# Patient Record
Sex: Female | Born: 1990 | Race: Black or African American | Hispanic: No | Marital: Single | Smoking: Never smoker
Health system: Southern US, Community
[De-identification: ages and names within clinical notes are randomized; demographics above are authoritative.]

---
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
## 2012-01-20 ENCOUNTER — Emergency Department (HOSPITAL_COMMUNITY)
Admission: EM | Admit: 2012-01-20 | Discharge: 2012-01-20 | Disposition: A | Discharge status: Home / Self Care | Payer: BC Managed Care – PPO | Source: Home / Self Care

## 2012-01-20 ENCOUNTER — Encounter (HOSPITAL_COMMUNITY): Payer: Self-pay | Admitting: Emergency Medicine

## 2012-01-20 DIAGNOSIS — K2970 Gastritis, unspecified, without bleeding: Secondary | ICD-10-CM

## 2012-01-20 LAB — POCT H PYLORI SCREEN: H. PYLORI SCREEN, POC: NEGATIVE

## 2012-01-20 MED ORDER — LACTINEX PO CHEW: 1.0000 | CHEWABLE_TABLET | Freq: Three times a day (TID) | ORAL | Status: DC

## 2012-01-20 MED ORDER — OMEPRAZOLE 20 MG PO CPDR: 20.0000 mg | DELAYED_RELEASE_CAPSULE | Freq: Every day | ORAL | Status: AC

## 2012-01-20 NOTE — ED Provider Notes (Signed)
Joanne Mosley  CC: abdominal pain  HPI:  21 y.o. female with abdominal pain starting 2 weeks ago. Pain is diffuse, across both sides. Worst is 7-8/10.  Happens 5-7 times a day, lasts for about 1 minute. Goes away by itself. There when wakes up. When moves a lot, sometimes when eating and gets nausea. Has not vomited.  Sometimes feels like gas pains, other times like going to have diarrhea, sometimes like period cramps or food poisoning. In AM feels like about to throw up. Occasional funny feeling in chest. Stool has different odor, not as solid. BM once a day. No blood or dark tarry stools. No dysuria, no fever/chills. Sister has same symptoms during same time frame and dx with H. Pylori recently. Sees Dr. Jacqualine Mau in [place]. Has been under a lot of stress recently. Pt has changed diet recently - eating more vegetables, vegetarian foods.  PMH:  Eczema  PSH:  None  MEDS:  OCPs, cream for eczema  No Known Allergies  SOC:  No smoking, no EtOH or drugs  ROS:  Negative except as stated in HPI  PHYS EXAM: Filed Vitals:   01/20/12 1016  BP: 122/64  Pulse: 92  Temp: 98.9 F (37.2 C)  Resp: 18   GEN:  WNWD, no distress HEENT:  Conjunctive clear, EOMI, NCAT CV:  RRR, no murmur RESP:  CTAB ABD:  Normal bowel sounds all 4 quadrants, mild tenderness in all 4 quadrants, none suprapubically, tympanitic to percussino SKIN:  WWP NEURO:  No focal defects, alert and oriented x 4  LABS:  H. Pylori rapid antibody test NEGATIVE  A/P 21 y.o. female with abdominal pain. Gastritis vs IBS vs reaction to vegetarian diet - Trial of prilosec and lactinex - OTC gas meds if needed - F/U with PCP in December as scheduled  Napoleon Form, MD   Napoleon Form, MD 01/20/12 1106

## 2012-01-20 NOTE — ED Notes (Signed)
Reports stomach pain for a week and half.  Patient says it is on and off pain.  Denies vomiting but does have nausea.  Patient cycle was two and half weeks ago.

## 2012-04-11 IMAGING — CR DG ANKLE COMPLETE 3+V*R*
3 series · 3 of 3 positions shown · non-contrast
Comparison: None

CLINICAL DATA: Twisting injury.  Pain and swelling laterally.

RIGHT ANKLE - COMPLETE 3+ VIEW

[view not recorded (1 of 3)]
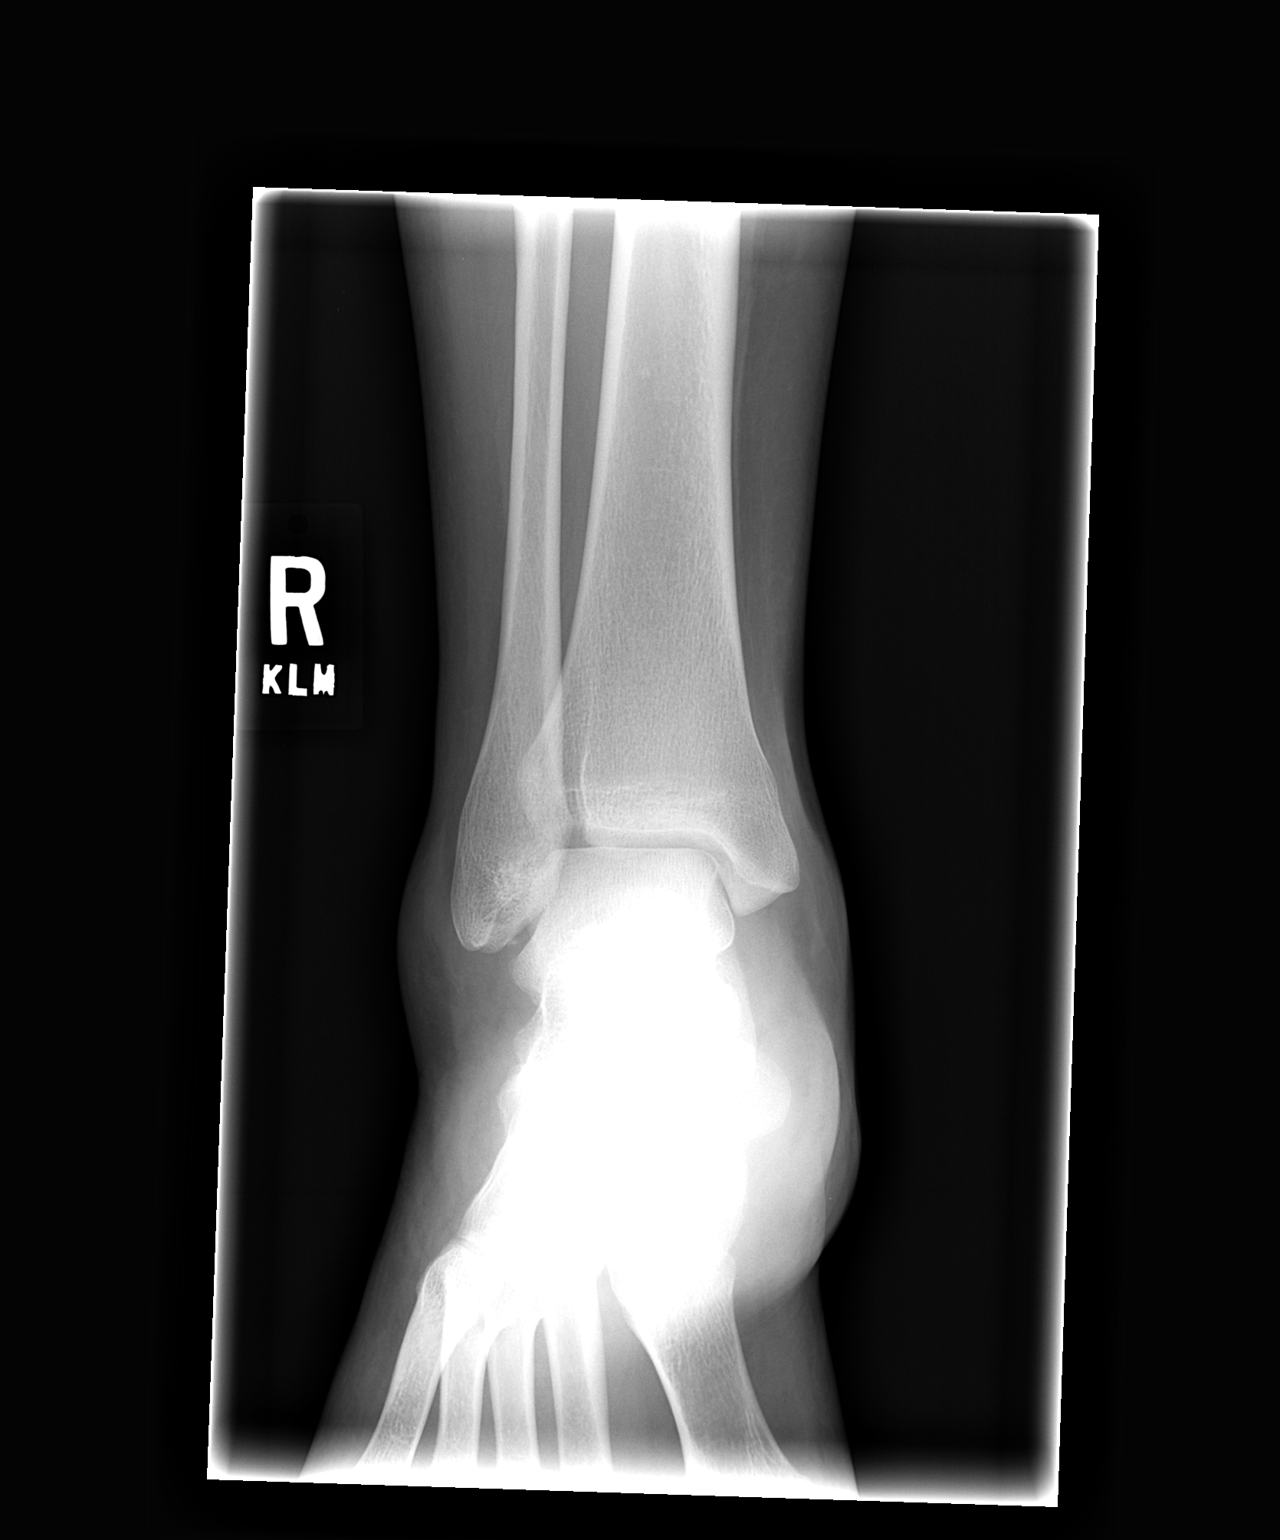

[view not recorded (2 of 3)]
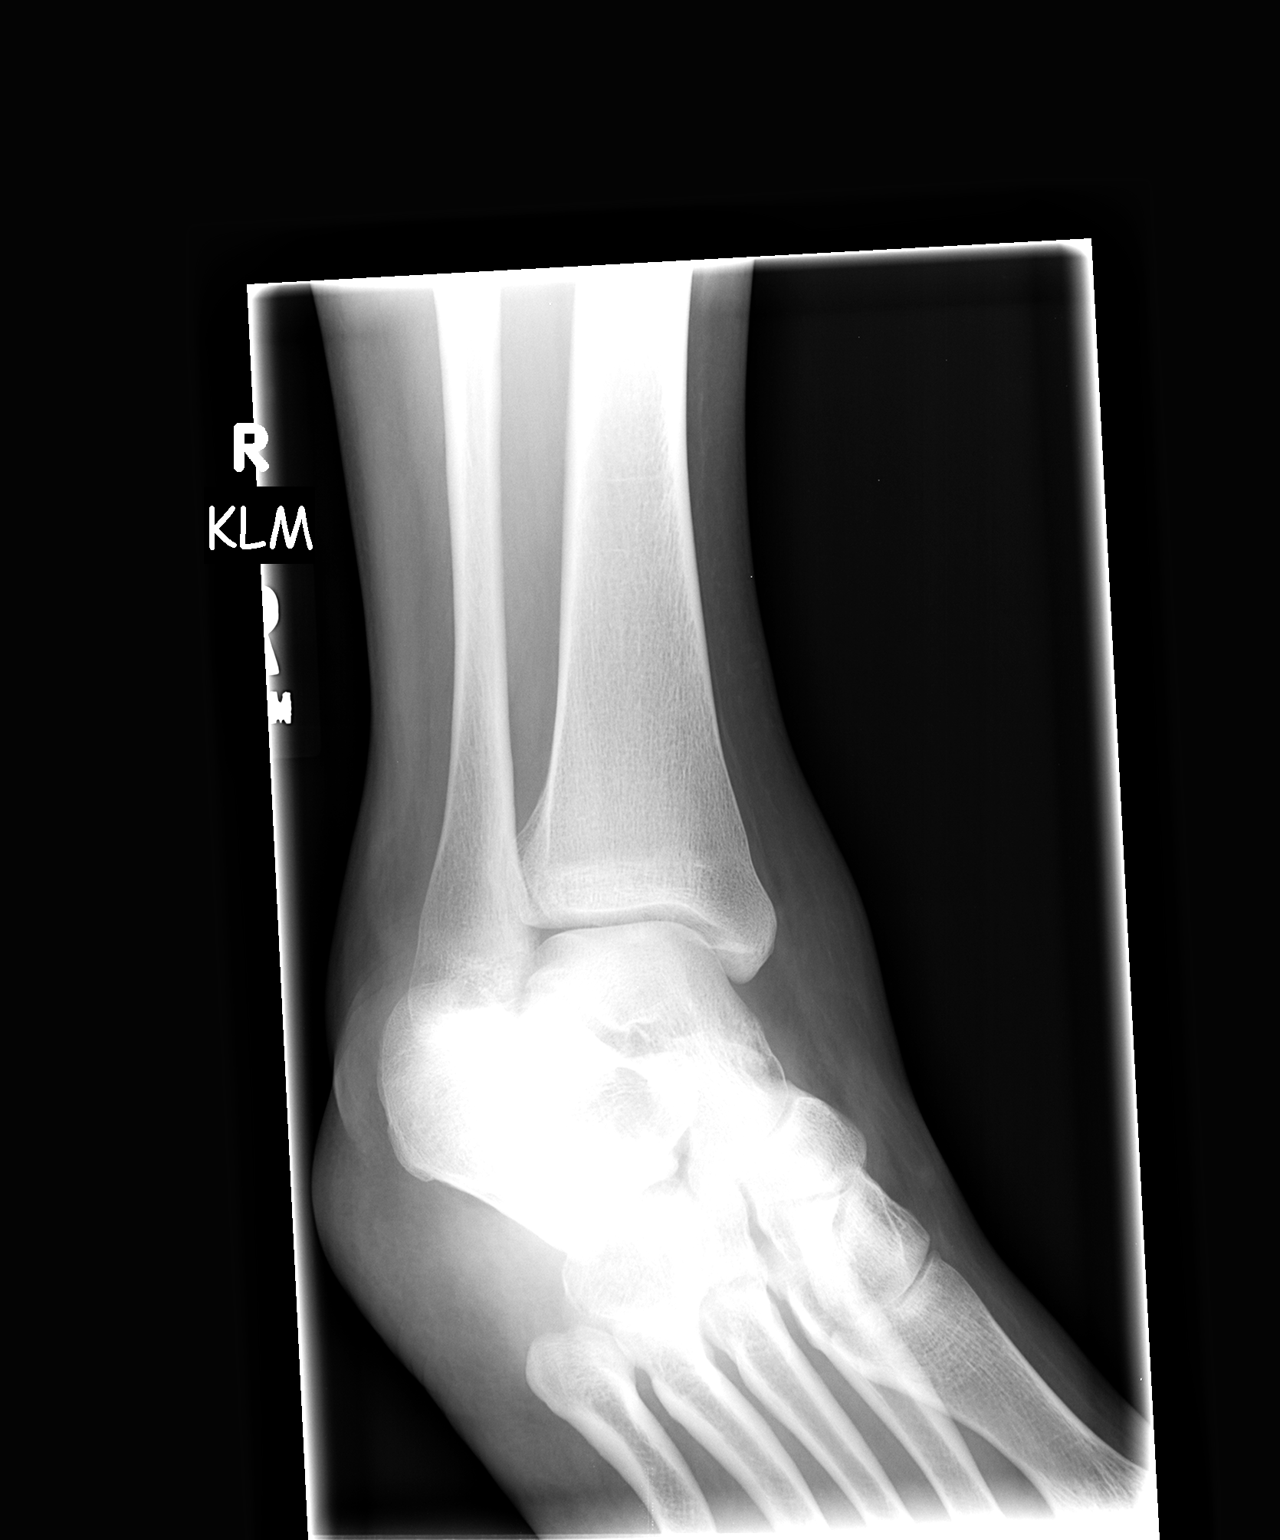

[view not recorded (3 of 3)]
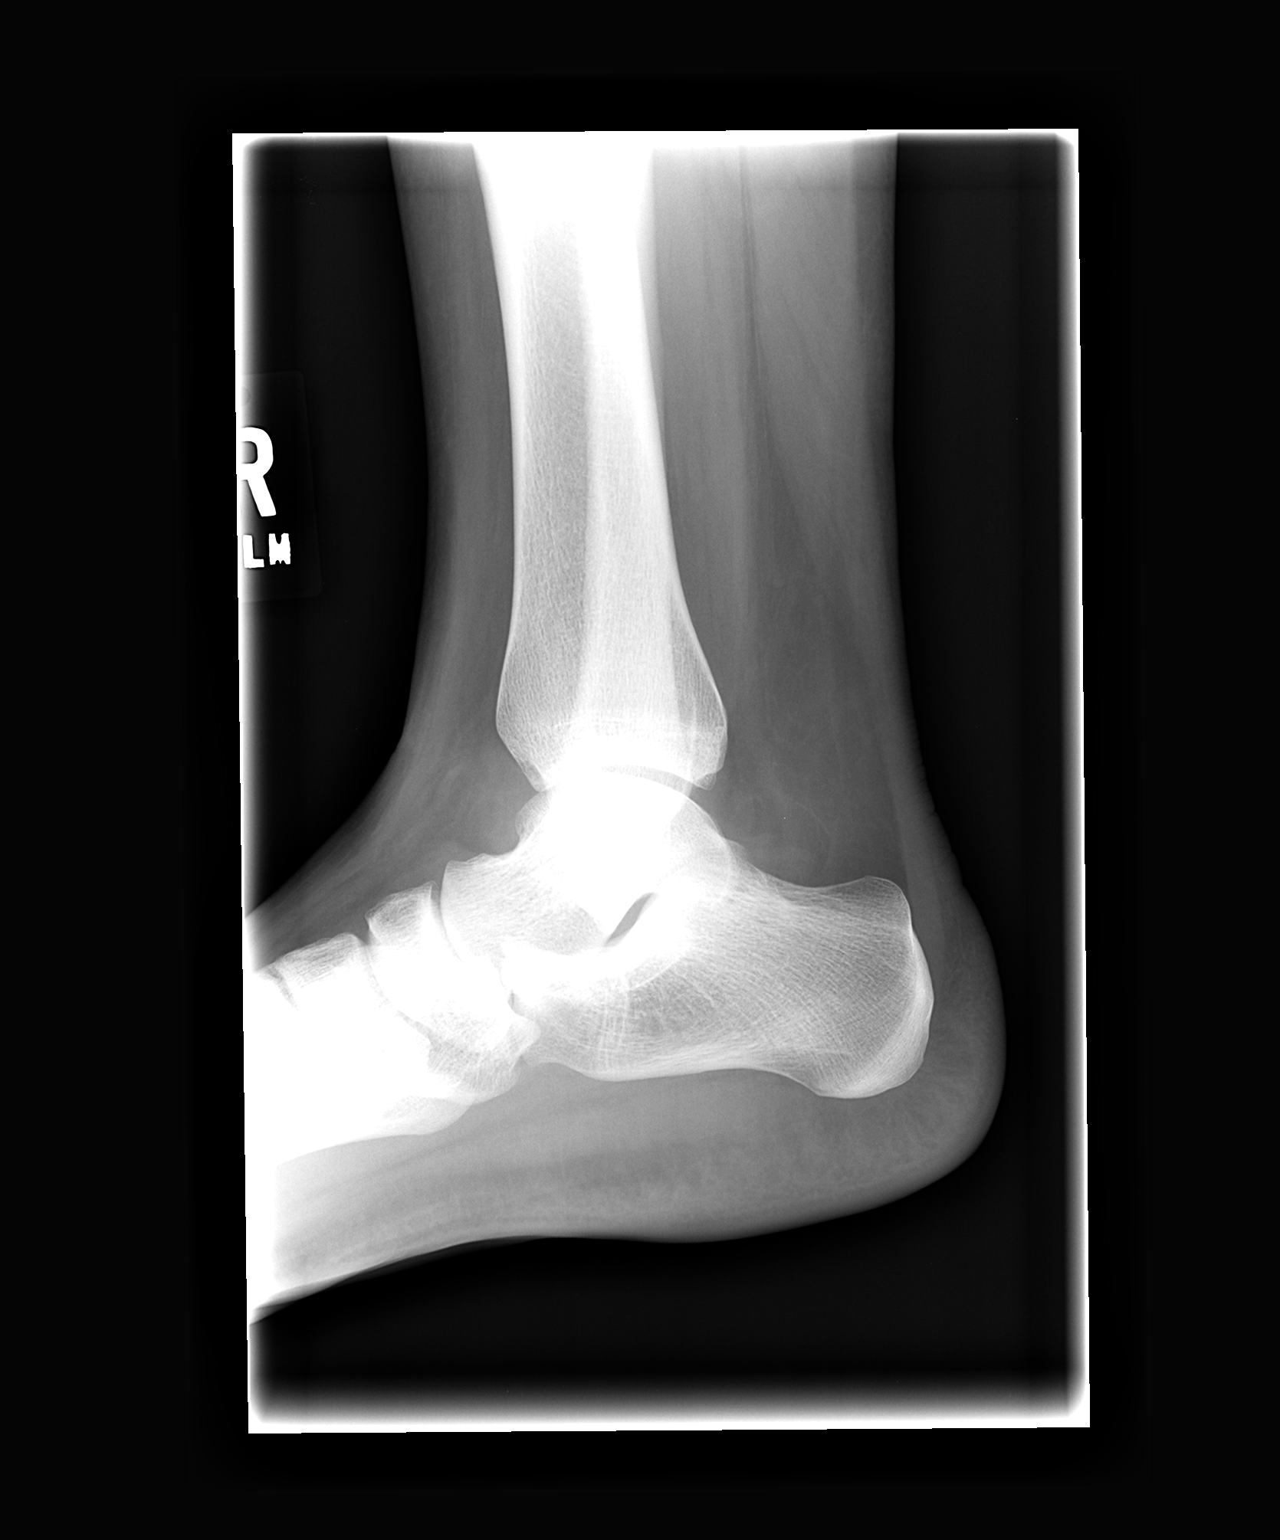

[3 of 3 positions shown; findings below may reference images not displayed]

FINDINGS: There is lateral soft tissue swelling.  There is a joint
effusion.  There may be a tiny ligamentous a avulsion at the tip of
the fibula.  No major fracture.
IMPRESSION: Joint effusion and lateral soft tissue swelling.  Possible tiny
ligamentous avulsion fracture at the tip of the fibula.

## 2013-08-12 ENCOUNTER — Emergency Department (HOSPITAL_COMMUNITY)
Admission: EM | Admit: 2013-08-12 | Discharge: 2013-08-12 | Disposition: A | Discharge status: Home / Self Care | Payer: BC Managed Care – PPO | Attending: Emergency Medicine | Admitting: Emergency Medicine

## 2013-08-12 ENCOUNTER — Encounter (HOSPITAL_COMMUNITY): Payer: Self-pay | Admitting: Emergency Medicine

## 2013-08-12 DIAGNOSIS — R1110 Vomiting, unspecified: Secondary | ICD-10-CM | POA: Insufficient documentation

## 2013-08-12 DIAGNOSIS — Z79899 Other long term (current) drug therapy: Secondary | ICD-10-CM | POA: Insufficient documentation

## 2013-08-12 LAB — POC URINE PREG, ED: PREG TEST UR: NEGATIVE

## 2013-08-12 LAB — CBC WITH DIFFERENTIAL/PLATELET
BASOS PCT: 0 % (ref 0–1)
Basophils Absolute: 0 10*3/uL (ref 0.0–0.1)
EOS ABS: 0 10*3/uL (ref 0.0–0.7)
Eosinophils Relative: 0 % (ref 0–5)
HEMATOCRIT: 38.7 % (ref 36.0–46.0)
HEMOGLOBIN: 12.7 g/dL (ref 12.0–15.0)
Lymphocytes Relative: 3 % — ABNORMAL LOW (ref 12–46)
Lymphs Abs: 0.6 10*3/uL — ABNORMAL LOW (ref 0.7–4.0)
MCH: 27.1 pg (ref 26.0–34.0)
MCHC: 32.8 g/dL (ref 30.0–36.0)
MCV: 82.7 fL (ref 78.0–100.0)
MONO ABS: 0.8 10*3/uL (ref 0.1–1.0)
MONOS PCT: 5 % (ref 3–12)
Neutro Abs: 15.4 10*3/uL — ABNORMAL HIGH (ref 1.7–7.7)
Neutrophils Relative %: 92 % — ABNORMAL HIGH (ref 43–77)
Platelets: 244 10*3/uL (ref 150–400)
RBC: 4.68 MIL/uL (ref 3.87–5.11)
RDW: 13.9 % (ref 11.5–15.5)
WBC: 16.8 10*3/uL — ABNORMAL HIGH (ref 4.0–10.5)

## 2013-08-12 LAB — COMPREHENSIVE METABOLIC PANEL
ALBUMIN: 3.6 g/dL (ref 3.5–5.2)
ALT: 11 U/L (ref 0–35)
AST: 15 U/L (ref 0–37)
Alkaline Phosphatase: 76 U/L (ref 39–117)
BILIRUBIN TOTAL: 0.4 mg/dL (ref 0.3–1.2)
BUN: 7 mg/dL (ref 6–23)
CO2: 24 mEq/L (ref 19–32)
CREATININE: 0.62 mg/dL (ref 0.50–1.10)
Calcium: 9.2 mg/dL (ref 8.4–10.5)
Chloride: 102 mEq/L (ref 96–112)
GFR calc Af Amer: >90 mL/min (ref >90)
GFR calc non Af Amer: >90 mL/min (ref >90)
Glucose, Bld: 90 mg/dL (ref 70–99)
Potassium: 4.2 mEq/L (ref 3.7–5.3)
Sodium: 139 mEq/L (ref 137–147)
TOTAL PROTEIN: 7.9 g/dL (ref 6.0–8.3)

## 2013-08-12 LAB — URINALYSIS, ROUTINE W REFLEX MICROSCOPIC
Bilirubin Urine: NEGATIVE
GLUCOSE, UA: NEGATIVE mg/dL
Hgb urine dipstick: NEGATIVE
Ketones, ur: NEGATIVE mg/dL
LEUKOCYTES UA: NEGATIVE
NITRITE: NEGATIVE
PH: 6.5 (ref 5.0–8.0)
PROTEIN: NEGATIVE mg/dL
Specific Gravity, Urine: 1.03 (ref 1.005–1.030)
Urobilinogen, UA: 1 mg/dL (ref 0.0–1.0)

## 2013-08-12 MED ORDER — ONDANSETRON HCL 8 MG PO TABS: 8.0000 mg | ORAL_TABLET | Freq: Three times a day (TID) | ORAL | Status: AC | PRN

## 2013-08-12 NOTE — Discharge Instructions (Signed)
Start with a clear liquid diet, then gradually advance to bland foods over the next one to 2 days. See the doctor of your choice as needed for problems.   Nausea and Vomiting Nausea is a sick feeling that often comes before throwing up (vomiting). Vomiting is a reflex where stomach contents come out of your mouth. Vomiting can cause severe loss of body fluids (dehydration). Children and elderly adults can become dehydrated quickly, especially if they also have diarrhea. Nausea and vomiting are symptoms of a condition or disease. It is important to find the cause of your symptoms. CAUSES   Direct irritation of the stomach lining. This irritation can result from increased acid production (gastroesophageal reflux disease), infection, food poisoning, taking certain medicines (such as nonsteroidal anti-inflammatory drugs), alcohol use, or tobacco use.  Signals from the brain.These signals could be caused by a headache, heat exposure, an inner ear disturbance, increased pressure in the brain from injury, infection, a tumor, or a concussion, pain, emotional stimulus, or metabolic problems.  An obstruction in the gastrointestinal tract (bowel obstruction).  Illnesses such as diabetes, hepatitis, gallbladder problems, appendicitis, kidney problems, cancer, sepsis, atypical symptoms of a heart attack, or eating disorders.  Medical treatments such as chemotherapy and radiation.  Receiving medicine that makes you sleep (general anesthetic) during surgery. DIAGNOSIS Your caregiver may ask for tests to be done if the problems do not improve after a few days. Tests may also be done if symptoms are severe or if the reason for the nausea and vomiting is not clear. Tests may include:  Urine tests.  Blood tests.  Stool tests.  Cultures (to look for evidence of infection).  X-rays or other imaging studies. Test results can help your caregiver make decisions about treatment or the need for additional  tests. TREATMENT You need to stay well hydrated. Drink frequently but in small amounts.You may wish to drink water, sports drinks, clear broth, or eat frozen ice pops or gelatin dessert to help stay hydrated.When you eat, eating slowly may help prevent nausea.There are also some antinausea medicines that may help prevent nausea. HOME CARE INSTRUCTIONS   Take all medicine as directed by your caregiver.  If you do not have an appetite, do not force yourself to eat. However, you must continue to drink fluids.  If you have an appetite, eat a normal diet unless your caregiver tells you differently.  Eat a variety of complex carbohydrates (rice, wheat, potatoes, bread), lean meats, yogurt, fruits, and vegetables.  Avoid high-fat foods because they are more difficult to digest.  Drink enough water and fluids to keep your urine clear or pale yellow.  If you are dehydrated, ask your caregiver for specific rehydration instructions. Signs of dehydration may include:  Severe thirst.  Dry lips and mouth.  Dizziness.  Dark urine.  Decreasing urine frequency and amount.  Confusion.  Rapid breathing or pulse. SEEK IMMEDIATE MEDICAL CARE IF:   You have blood or brown flecks (like coffee grounds) in your vomit.  You have black or bloody stools.  You have a severe headache or stiff neck.  You are confused.  You have severe abdominal pain.  You have chest pain or trouble breathing.  You do not urinate at least once every 8 hours.  You develop cold or clammy skin.  You continue to vomit for longer than 24 to 48 hours.  You have a fever. MAKE SURE YOU:   Understand these instructions.  Will watch your condition.  Will get help  right away if you are not doing well or get worse. Document Released: 02/13/2005 Document Revised: 05/08/2011 Document Reviewed: 07/13/2010 [facility] Patient Information 2014 [employer], [place]. Diet The clear liquid diet consists of foods that are  liquid or will become liquid at room temperature. Examples of foods allowed on a clear liquid diet include fruit juice, broth or bouillon, gelatin, or frozen ice pops. You should be able to see through the liquid. The purpose of this diet is to provide the necessary fluids, electrolytes (such as sodium and potassium), and energy to keep the body functioning during times when you are not able to consume a regular diet. A clear liquid diet should not be continued for long periods of time, as it is not nutritionally adequate.  A CLEAR LIQUID DIET MAY BE NEEDED:  When a sudden-onset (acute) condition occurs before or after surgery.   As the first step in oral feeding.   For fluid and electrolyte replacement in diarrheal diseases.   As a diet before certain medical tests are performed.  ADEQUACY The clear liquid diet is adequate only in ascorbic acid, according to the Recommended Dietary Allowances of the [employer].  CHOOSING FOODS Breads and Starches  Allowed: None are allowed.   Avoid: All are to be avoided.  Vegetables  Allowed: Strained vegetable juices.   Avoid: Any others.  Fruit  Allowed: Strained fruit juices and fruit drinks. Include 1 serving of citrus or vitamin C-enriched fruit juice daily.   Avoid: Any others.  Meat and Meat Substitutes  Allowed: None are allowed.   Avoid: All are to be avoided.  Milk Products  Allowed: None are allowed.   Avoid: All are to be avoided.  Soups and Combination Foods  Allowed: Clear bouillon, broth, or strained broth-based soups.   Avoid: Any others.  Desserts and Sweets  Allowed: Sugar, honey. High-protein gelatin. Flavored gelatin, ices, or frozen ice pops that do not contain milk.   Avoid: Any others.  Fats and Oils  Allowed: None are allowed.   Avoid: All are to be avoided.  Beverages  Allowed: Cereal beverages, coffee (regular or decaffeinated), tea, or  soda at the discretion of your health care provider.   Avoid: Any others.  Condiments  Allowed: Salt.   Avoid: Any others, including pepper.  Supplements  Allowed: Liquid nutrition beverages that you can see through.   Avoid: Any others that contain lactose or fiber. SAMPLE MEAL PLAN Breakfast  4 oz (120 mL) strained orange juice.   to 1 cup (120 to 240 mL) gelatin (plain or fortified).  1 cup (240 mL) beverage (coffee or tea).  Sugar, if desired. Midmorning Snack   cup (120 mL) gelatin (plain or fortified). Lunch  1 cup (240 mL) broth or consomm.  4 oz (120 mL) strained grapefruit juice.   cup (120 mL) gelatin (plain or fortified).  1 cup (240 mL) beverage (coffee or tea).  Sugar, if desired. Midafternoon Snack   cup (120 mL) fruit ice.   cup (120 mL) strained fruit juice. Dinner  1 cup (240 mL) broth or consomm.   cup (120 mL) cranberry juice.   cup (120 mL) flavored gelatin (plain or fortified).  1 cup (240 mL) beverage (coffee or tea).  Sugar, if desired. Evening Snack  4 oz (120 mL) strained apple juice (vitamin C-fortified).   cup (120 mL) flavored gelatin (plain or fortified). MAKE SURE YOU:  Understand these instructions.  Will watch your child's condition.  Will get help right  away if your child is not doing well or gets worse. Document Released: 02/13/2005 Document Revised: 10/16/2012 Document Reviewed: 07/16/2012 [facility] PhoenixExitCare Patient Information 2014 [place]ExitCare, [place]LLC.

## 2013-08-12 NOTE — ED Provider Notes (Signed)
CSN: [id number]     Arrival date & time 08/12/13  1456 History   First MD Initiated Contact with Patient 08/12/13 1757     Chief Complaint  Patient presents with  . Abdominal Pain     (Consider location/radiation/quality/duration/timing/severity/associated sxs/prior Treatment) Patient is a 23 y.o. female presenting with abdominal pain. The history is provided by the patient and a relative.  Abdominal Pain   The patient presents for evaluation of vomiting, and abdominal pain. She had several episodes of vomiting that were followed by 2 episodes of bloody emesis. She has not had any diarrhea. Her symptoms began this morning, when she had a sore throat, and decreased appetite. Later, she was walking outside and felt hot, so went indoors and drank some water. Shortly after that she began vomiting. She is concerned because when she was outside today, for about an hour, she began to have the problems mentioned above. She came here for evaluation by private vehicle. She's never had this happen previously. She takes oral contraceptives, and does not believe she is pregnant.  There are no other known modifying factors  History reviewed. No pertinent past medical history. History reviewed. No pertinent past surgical history. History reviewed. No pertinent family history. History  Substance Use Topics  . Smoking status: Never Smoker   . Smokeless tobacco: Not on file  . Alcohol Use: Yes     Comment: rare   OB History   Grav Para Term Preterm Abortions TAB SAB Ect Mult Living                 Review of Systems  Gastrointestinal: Positive for abdominal pain.  All other systems reviewed and are negative.     Allergies  Review of patient's allergies indicates no known allergies.  Home Medications   Prior to Admission medications   Medication Sig Start Date End Date Taking? Authorizing Christasia Angeletti  Levonorgestrel-Ethinyl Estrad (SRONYX PO) Take by mouth.   Yes Historical Saifan Rayford, MD   omeprazole (PRILOSEC) 20 MG capsule Take 1 capsule (20 mg total) by mouth daily. 01/20/12  Yes Napoleon FormPamela Ferry, MD  ondansetron (ZOFRAN) 8 MG tablet Take 1 tablet (8 mg total) by mouth every 8 (eight) hours as needed for nausea or vomiting. 08/12/13   Flint MelterElliott L Wentz, MD   BP 119/58  Pulse 99  Temp(Src) 98.8 F (37.1 C) (Oral)  Resp 24  Ht 5\' 11"  (1.803 m)  Wt 297 lb 14.4 oz (135.127 kg)  BMI 41.57 kg/m2  SpO2 100% Physical Exam  Nursing note and vitals reviewed. Constitutional: She is oriented to person, place, and time. She appears well-developed and well-nourished.  HENT:  Head: Normocephalic and atraumatic.  Eyes: Conjunctivae and EOM are normal. Pupils are equal, round, and reactive to light.  Neck: Normal range of motion and phonation normal. Neck supple.  Cardiovascular: Normal rate, regular rhythm and intact distal pulses.   Pulmonary/Chest: Effort normal and breath sounds normal. She exhibits no tenderness.  Abdominal: Soft. She exhibits no distension and no mass. There is tenderness (epigastric tenderness, mild). There is no rebound and no guarding.  Musculoskeletal: Normal range of motion.  Neurological: She is alert and oriented to person, place, and time. She exhibits normal muscle tone.  Skin: Skin is warm and dry.  Psychiatric: She has a normal mood and affect. Her behavior is normal. Judgment and thought content normal.    ED Course  Procedures (including critical care time) Medications - No data to display  Patient Vitals for  the past 24 hrs:  BP Temp Temp src Pulse Resp SpO2 Height Weight  08/12/13 1942 - 98.8 F (37.1 C) Oral 99 24 100 % - -  08/12/13 1813 119/58 mmHg 98.8 F (37.1 C) Oral 106 23 97 % - -  08/12/13 1721 123/73 mmHg 97.8 F (36.6 C) Oral 98 18 98 % - -  08/12/13 1524 140/73 mmHg 98.4 F (36.9 C) Oral 122 17 98 % 5\' 11"  (1.803 m) 297 lb 14.4 oz (135.127 kg)   1935 Reevaluation with update and discussion. After initial assessment and  treatment, an updated evaluation reveals she is comfortable, no vomiting. Findings discussed with pt and family member, all questions answered. WENTZ,ELLIOTT L    Labs Review Labs Reviewed  CBC WITH DIFFERENTIAL - Abnormal; Notable for the following:    WBC 16.8 (*)    Neutrophils Relative % 92 (*)    Neutro Abs 15.4 (*)    Lymphocytes Relative 3 (*)    Lymphs Abs 0.6 (*)    All other components within normal limits  URINALYSIS, ROUTINE W REFLEX MICROSCOPIC - Abnormal; Notable for the following:    APPearance CLOUDY (*)    All other components within normal limits  COMPREHENSIVE METABOLIC PANEL  POC URINE PREG, ED    Imaging Review No results found.   EKG Interpretation None      MDM   Final diagnoses:  Vomiting    Nonspecific vomiting, possibly heat related illness. Pt improved with treatment.  Nursing Notes Reviewed/ Care Coordinated Applicable Imaging Reviewed Interpretation of Laboratory Data incorporated into ED treatment  The patient appears reasonably screened and/or stabilized for discharge and I doubt any other medical condition or other [facility] requiring further screening, evaluation, or treatment in the ED at this time prior to discharge.  Plan: Home Medications- Zofran; Home Treatments- gradually advance diet; return here if the recommended treatment, does not improve the symptoms; Recommended follow up- PCP prn    Flint MelterElliott L Wentz, MD 08/13/13 1145

## 2013-08-12 NOTE — ED Notes (Signed)
She was driving her car without air for approx 1 hour and since then shes had nausea and abd pain and just doesn't feel well. Her family brought her to ED because they are worried she may have gotten "overheated."

## 2014-04-20 ENCOUNTER — Telehealth: Payer: Self-pay | Admitting: *Deleted

## 2014-04-21 NOTE — Telephone Encounter (Signed)
Open in error

## 2015-01-13 ENCOUNTER — Other Ambulatory Visit: Payer: Self-pay | Admitting: Pediatrics

## 2015-01-13 DIAGNOSIS — E040 Nontoxic diffuse goiter: Secondary | ICD-10-CM

## 2015-01-13 DIAGNOSIS — E049 Nontoxic goiter, unspecified: Secondary | ICD-10-CM

## 2015-01-18 ENCOUNTER — Ambulatory Visit: Payer: Self-pay

## 2015-01-29 ENCOUNTER — Ambulatory Visit
Admission: RE | Admit: 2015-01-29 | Discharge: 2015-01-29 | Disposition: A | Discharge status: Home / Self Care | Payer: Managed Care, Other (non HMO) | Source: Ambulatory Visit | Attending: Pediatrics | Admitting: Pediatrics

## 2015-01-29 ENCOUNTER — Ambulatory Visit: Payer: Self-pay

## 2015-01-29 DIAGNOSIS — R590 Localized enlarged lymph nodes: Secondary | ICD-10-CM | POA: Insufficient documentation

## 2015-01-29 DIAGNOSIS — E049 Nontoxic goiter, unspecified: Secondary | ICD-10-CM | POA: Diagnosis not present

## 2015-01-29 DIAGNOSIS — E040 Nontoxic diffuse goiter: Secondary | ICD-10-CM

## 2017-12-01 IMAGING — US US SOFT TISSUE HEAD/NECK
1 series · 14 of 25 positions shown · non-contrast
Comparison: None.

CLINICAL DATA: 24-year-old female with diffuse thyroid goiter

EXAM:
THYROID ULTRASOUND
TECHNIQUE: Ultrasound examination of the thyroid gland and adjacent soft
tissues was performed.

[Series 1: us soft tissue head/neck · 0.07mm/px · 14 of 61 slices shown]
[im 1/61]
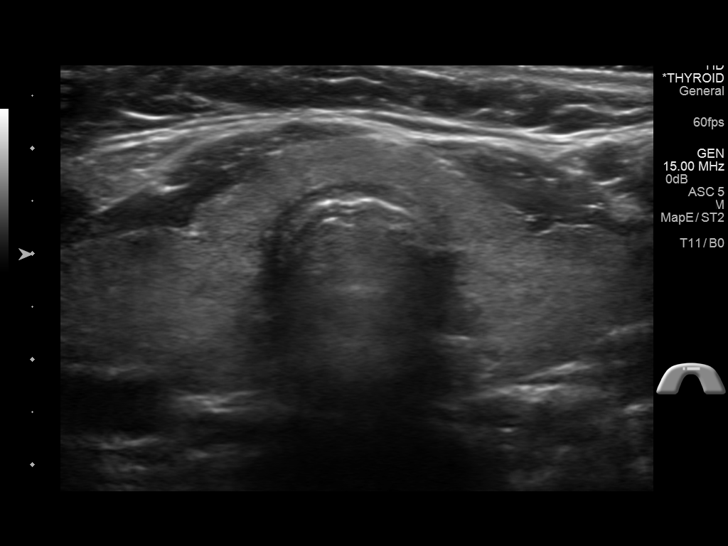
[im 6/61]
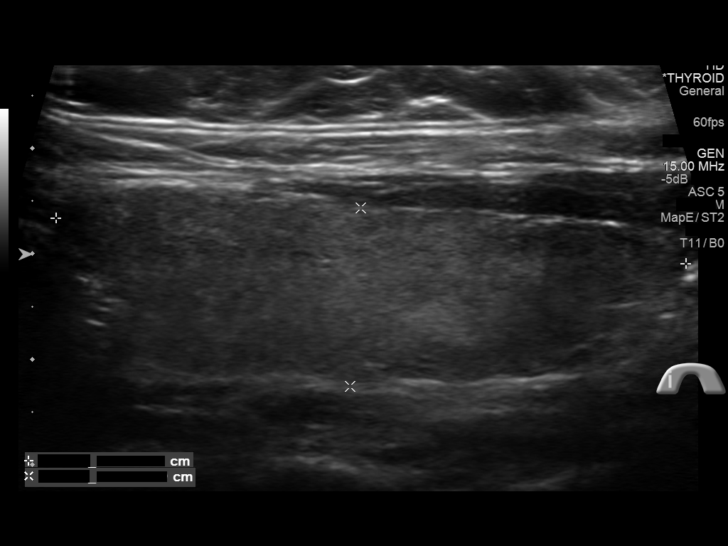
[im 11/61]
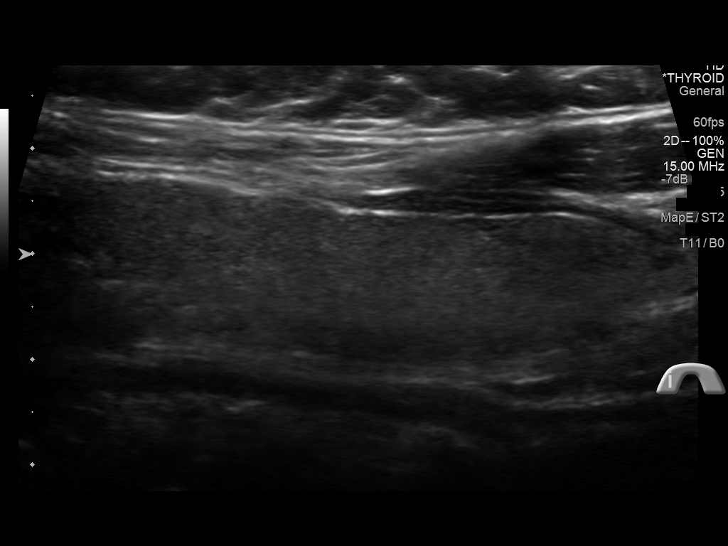
[im 16/61]
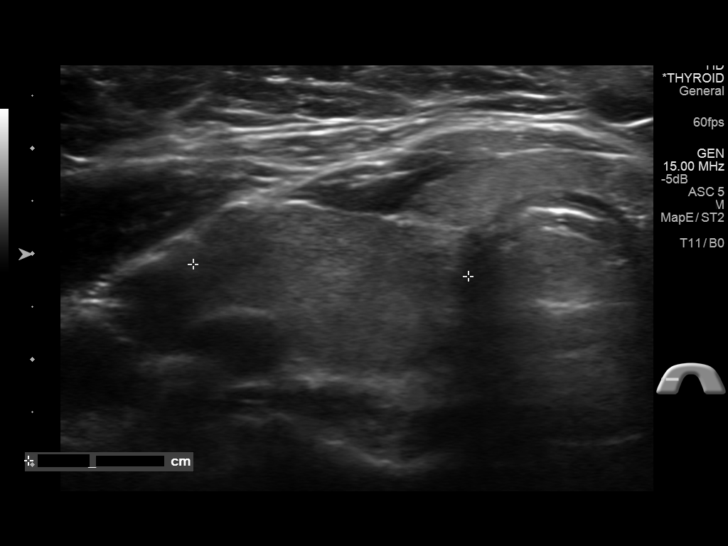
[im 21/61]
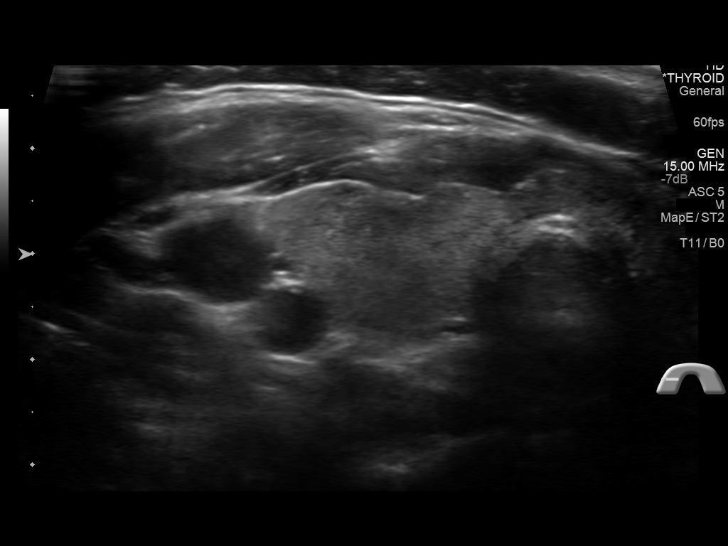
[im 23/61]
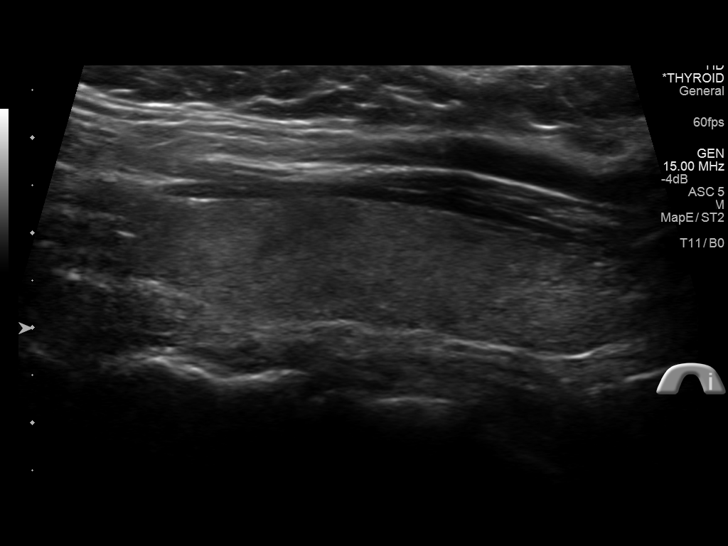
[im 28/61]
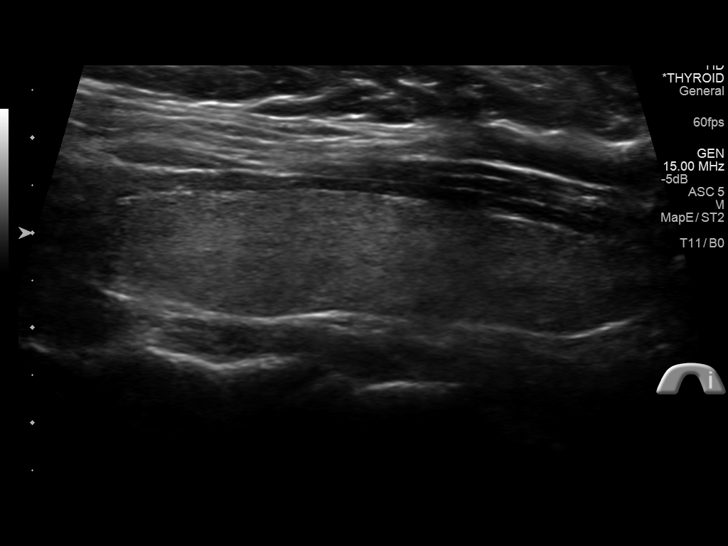
[im 33/61]
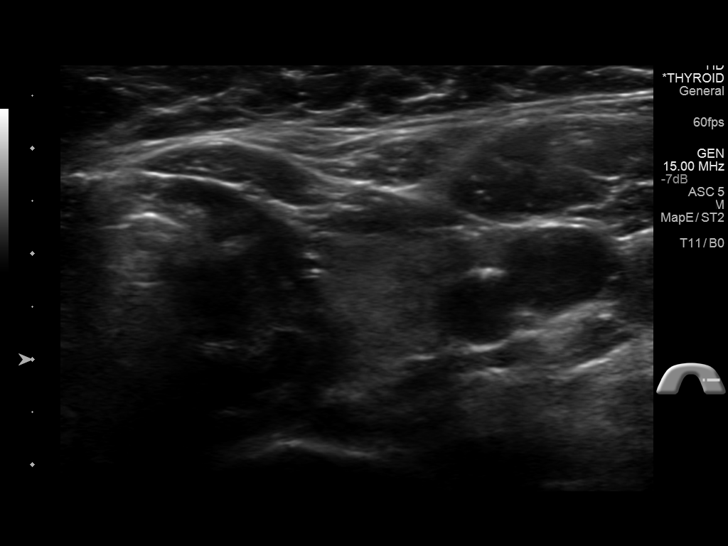
[im 38/61]
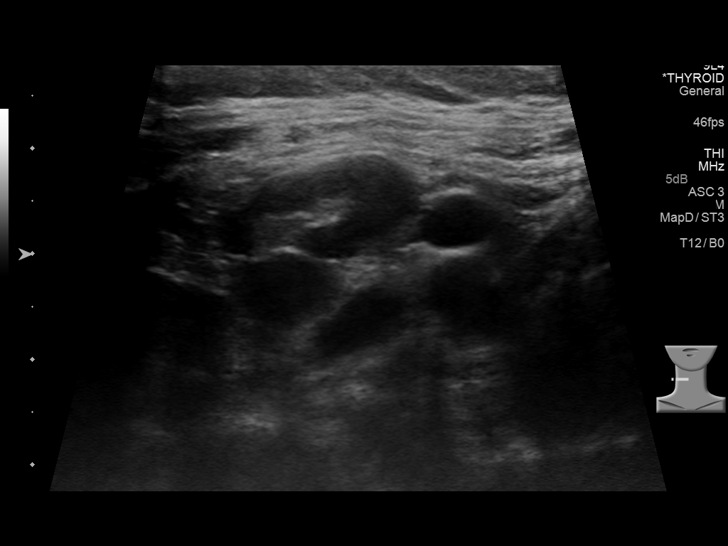
[im 41/61]
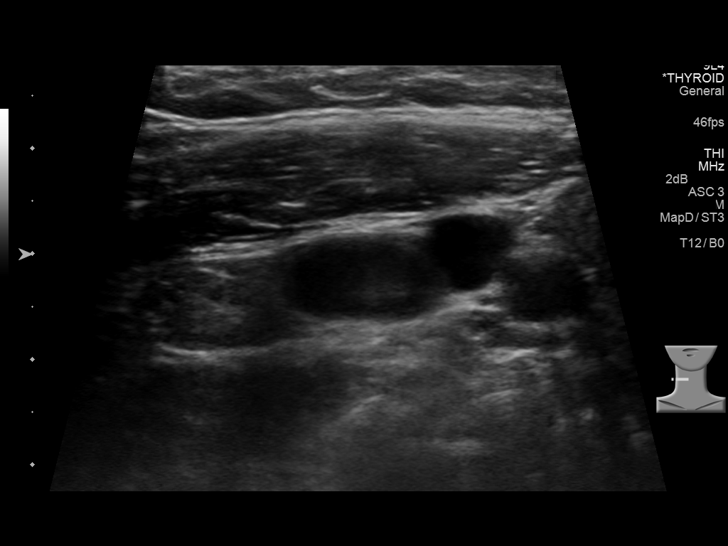
[im 46/61]
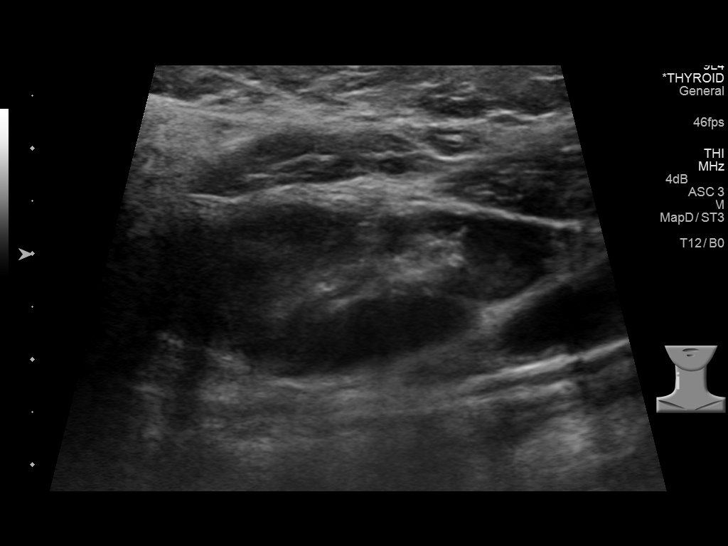
[im 51/61]
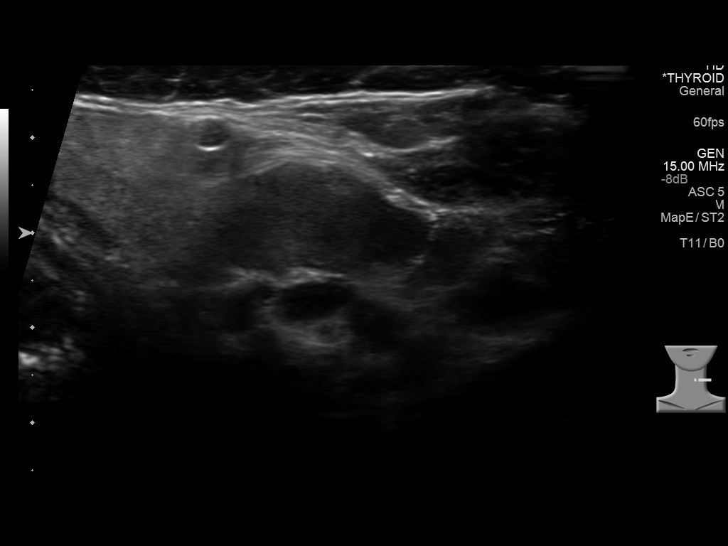
[im 56/61]
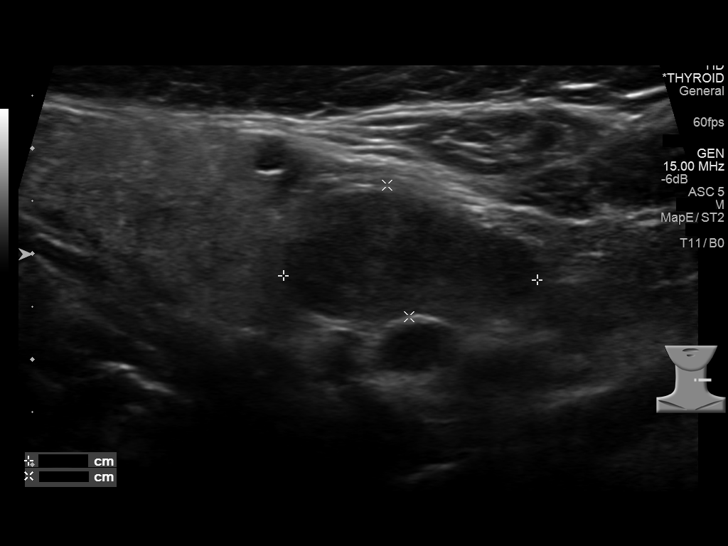
[im 61/61]
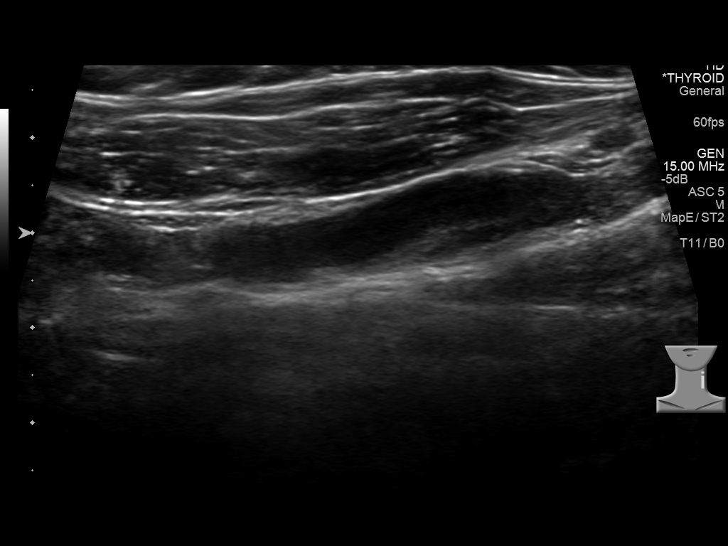

[14 of 25 positions shown; findings below may reference images not displayed]

FINDINGS: Right thyroid lobe

Measurements: 6.0 x 1.7 x 2.6 cm.  No nodules visualized.

Left thyroid lobe

Measurements: 6.1 x 1.3 x 2.1 cm.  No nodules visualized.

Isthmus

Thickness: 0.5 cm.  No nodules visualized.

Lymphadenopathy

Prominent bilateral jugular chain lymph nodes on either side of the
thyroid gland. On the right, the largest node measures up to 1.5 cm
in short axis while on the left the largest known measures up to
cm in short axis.
IMPRESSION: 1. Sonographically unremarkable appearance of the thyroid gland.
2. Prominent bilateral cervical lymphadenopathy. Given clinical
symptoms of sore throat and cold for the past 2 months, the
adenopathy is likely reactive in this age demographic. However, if
lymphadenopathy persists or progresses, then further evaluation with
ultrasound-guided biopsy may become warranted.
